# Patient Record
Sex: Male | Born: 1995 | Race: Black or African American | Hispanic: No | Marital: Single | Smoking: Never smoker
Health system: Southern US, Community
[De-identification: ages and names within clinical notes are randomized; demographics above are authoritative.]

---

## 2017-01-02 ENCOUNTER — Ambulatory Visit
Admission: RE | Admit: 2017-01-02 | Discharge: 2017-01-02 | Disposition: A | Discharge status: Home / Self Care | Payer: Federal, State, Local not specified - PPO | Source: Ambulatory Visit | Attending: Family Medicine | Admitting: Family Medicine

## 2017-01-02 ENCOUNTER — Other Ambulatory Visit: Payer: Self-pay | Admitting: Family Medicine

## 2017-01-02 DIAGNOSIS — M25522 Pain in left elbow: Secondary | ICD-10-CM | POA: Diagnosis present

## 2017-01-02 DIAGNOSIS — M25422 Effusion, left elbow: Secondary | ICD-10-CM | POA: Diagnosis present

## 2017-01-05 ENCOUNTER — Ambulatory Visit (INDEPENDENT_AMBULATORY_CARE_PROVIDER_SITE_OTHER): Payer: Federal, State, Local not specified - PPO | Admitting: Family Medicine

## 2017-01-05 ENCOUNTER — Encounter: Payer: Self-pay | Admitting: Family Medicine

## 2017-01-05 DIAGNOSIS — M7022 Olecranon bursitis, left elbow: Secondary | ICD-10-CM

## 2017-01-05 DIAGNOSIS — S5002XA Contusion of left elbow, initial encounter: Secondary | ICD-10-CM

## 2017-01-05 NOTE — Progress Notes (Signed)
Patient presents today with left elbow pain. Patient states that a week ago he was playing basketball with some people and landed on his elbow a few times. Later that day the elbow was quite swollen and painful. He denies any abrasions or cuts to the area. He went to the ER and had x-rays done and was put in a splint. He had repeat x-rays done a week later which did not show any fracture. He states that the swelling has gone down some. He took off the splint yesterday. He denies any other systemic symptoms. He has been taking occasional Ibuprofen. His pain is localized to the olecranon bursa area which is swollen.   ROS: Negative except mentioned above. Vitals as per Epic GENERAL: NAD MSK: Left elbow - no abrasions noted along the elbow area, no erythema or streaks appreciated, no warmth appreciated, there is swelling of the olecranon bursa, this area is tender, no bony tenderness appreciated, normal extension, mild decrease in flexion due to discomfort in the olecranon area, NV intact NEURO: CN II-XII grossly intact   A/P: Left elbow contusion, bursitis - discussed with patient to continue to take Ibuprofen or Aleve, RICE, work on range of motion with trainer when possible, avoid injury to the area again, seek medical attention if symptoms persist or worsen as discussed.

## 2018-02-19 IMAGING — CR DG ELBOW COMPLETE 3+V*L*
1 series · 3 of 3 positions shown · non-contrast
Comparison: None.

CLINICAL DATA: Injury with pain and swelling in the left elbow.

EXAM:
LEFT ELBOW - COMPLETE 3+ VIEW

[Series 1: dg elbow complete left (3+view) · 0.14mm/px · 3 of 3 slices shown]
[im 1/3]
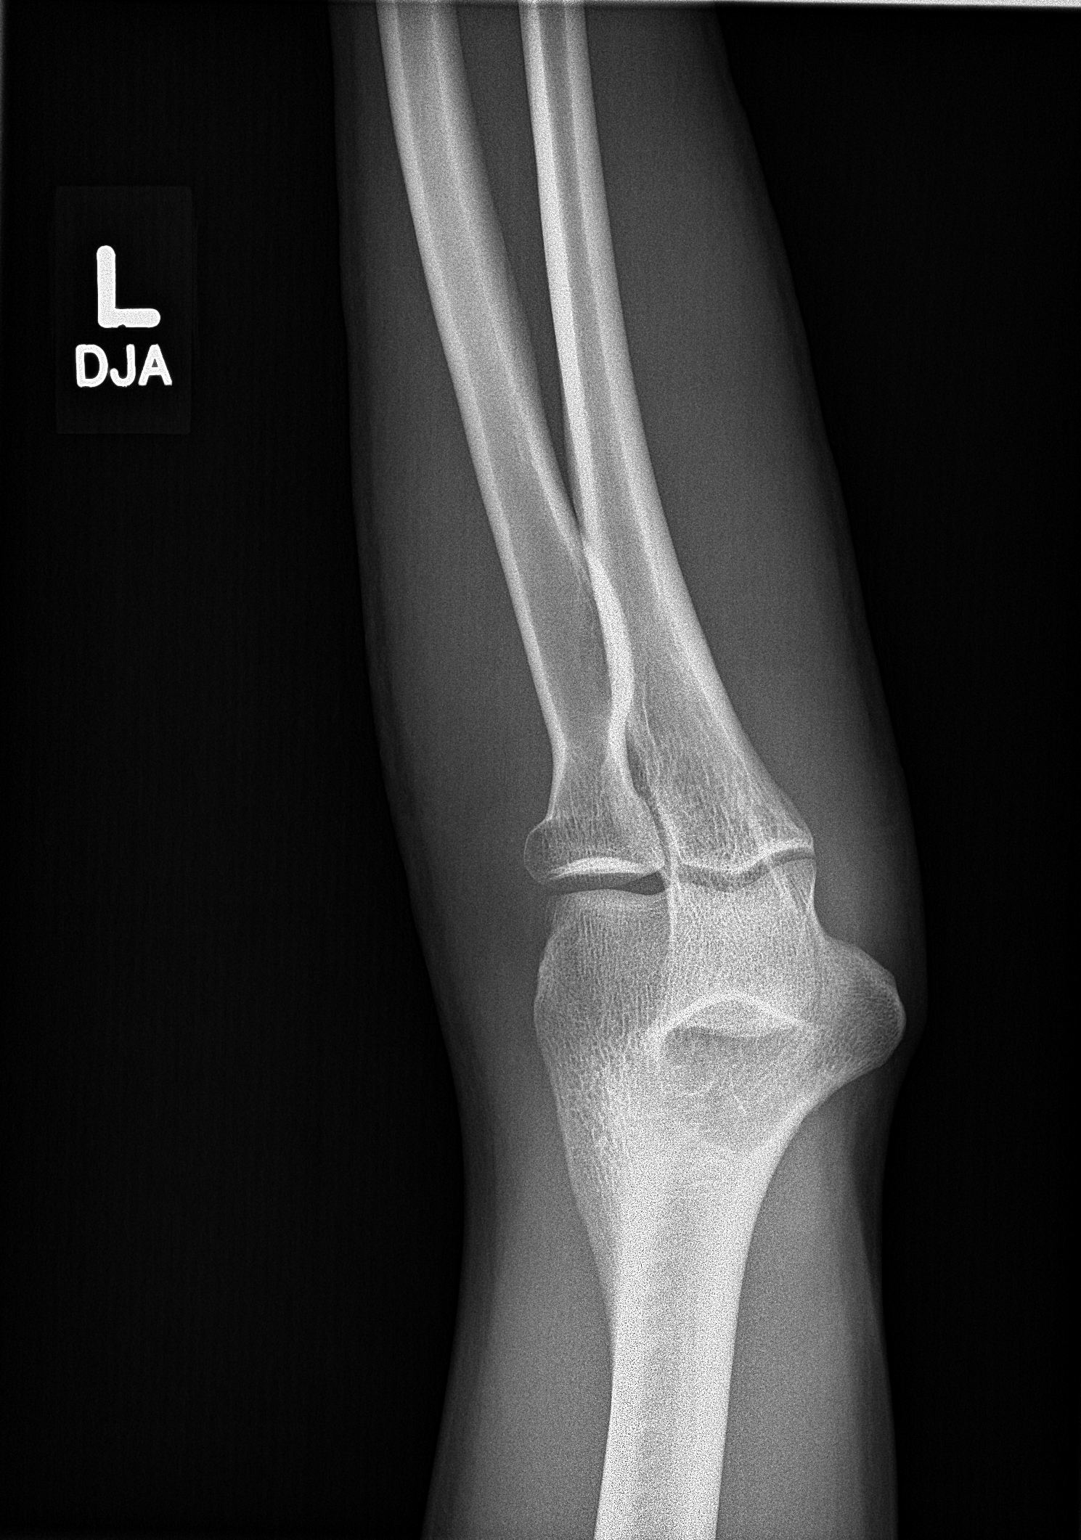
[im 2/3]
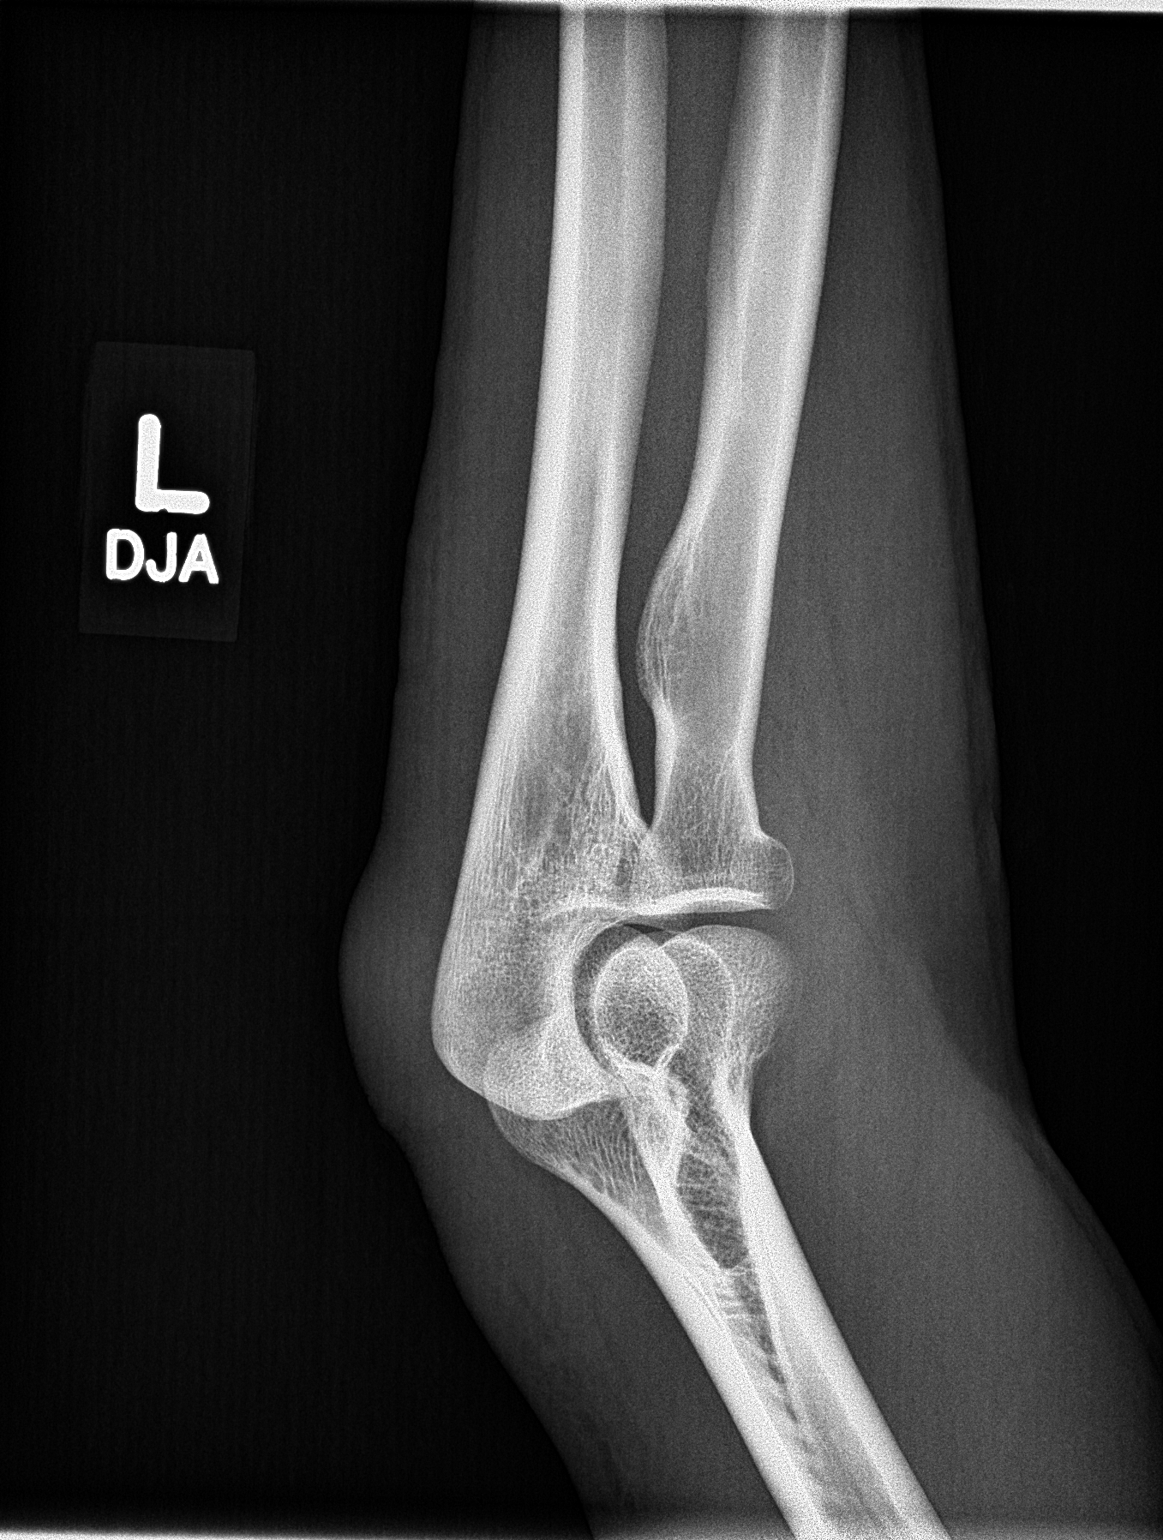
[im 3/3]
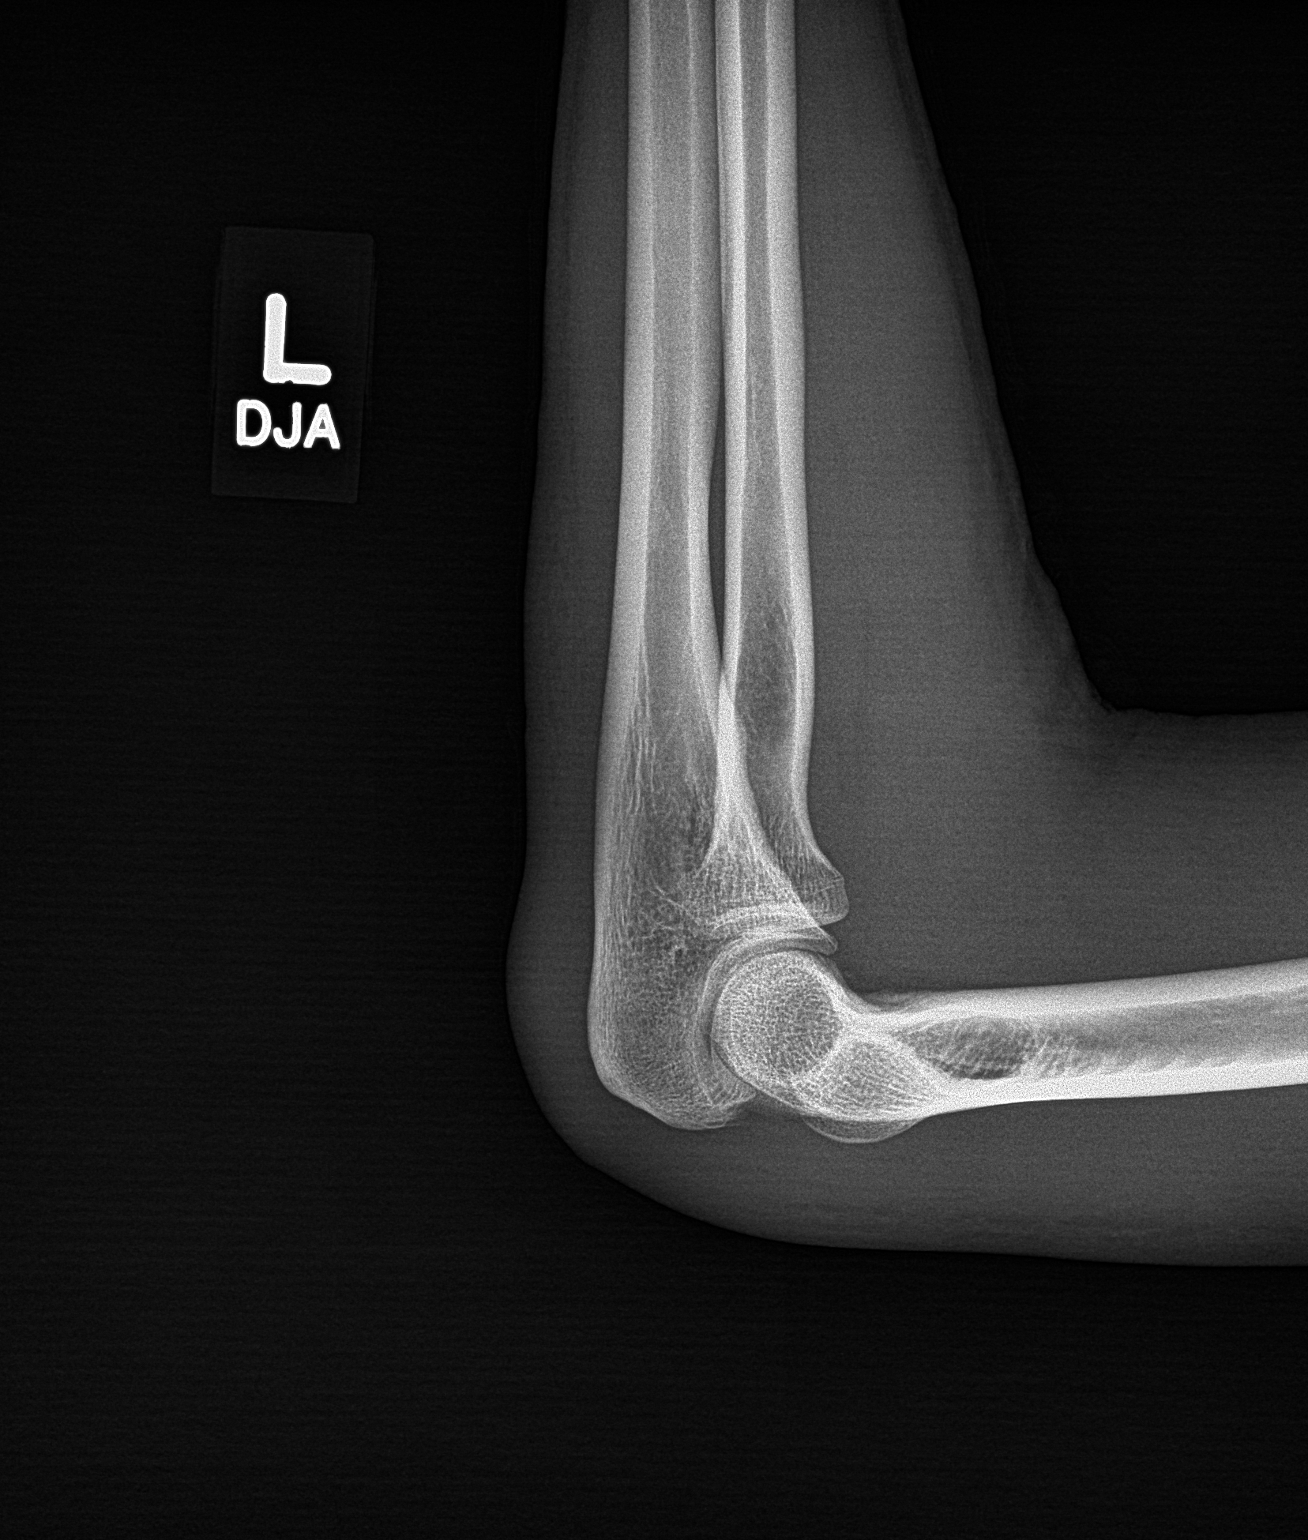

[3 of 3 positions shown; findings below may reference images not displayed]

FINDINGS: Left elbow is located without a fracture. No evidence for a joint
effusion. There may be soft tissue swelling along the olecranon.
Alignment of left elbow is normal.
IMPRESSION: No acute bone abnormality.
